# Patient Record
Sex: Female | Born: 2005 | Hispanic: Yes | Marital: Single
Health system: Southern US, Community
[De-identification: ages and names within clinical notes are randomized; demographics above are authoritative.]

---
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
## 2024-01-03 DIAGNOSIS — Z419 Encounter for procedure for purposes other than remedying health state, unspecified: Secondary | ICD-10-CM | POA: Diagnosis not present

## 2024-03-04 DIAGNOSIS — Z419 Encounter for procedure for purposes other than remedying health state, unspecified: Secondary | ICD-10-CM | POA: Diagnosis not present
